# Patient Record
Sex: Male | Born: 1985 | Race: White | Hispanic: No | Marital: Married | Smoking: Never smoker
Health system: Southern US, Community
[De-identification: ages and names within clinical notes are randomized; demographics above are authoritative.]

## PROBLEM LIST (undated history)

## (undated) HISTORY — PX: TONSILLECTOMY: SUR1361

---
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
## 2005-02-17 ENCOUNTER — Encounter: Admission: RE | Admit: 2005-02-17 | Discharge: 2005-02-17 | Payer: Self-pay | Admitting: Occupational Medicine

## 2005-07-23 ENCOUNTER — Emergency Department (HOSPITAL_COMMUNITY): Admission: EM | Admit: 2005-07-23 | Discharge: 2005-07-23 | Payer: Self-pay | Admitting: *Deleted
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
## 2009-12-20 ENCOUNTER — Emergency Department (HOSPITAL_BASED_OUTPATIENT_CLINIC_OR_DEPARTMENT_OTHER): Admission: EM | Admit: 2009-12-20 | Discharge: 2009-12-20 | Payer: Self-pay | Admitting: Emergency Medicine

## 2009-12-20 ENCOUNTER — Ambulatory Visit: Payer: Self-pay | Admitting: Interventional Radiology

## 2011-01-14 LAB — BASIC METABOLIC PANEL
BUN: 19 mg/dL (ref 6–23)
CO2: 34 mEq/L — ABNORMAL HIGH (ref 19–32)
Calcium: 9 mg/dL (ref 8.4–10.5)
Chloride: 102 mEq/L (ref 96–112)
Creatinine, Ser: 1.1 mg/dL (ref 0.4–1.5)
GFR calc Af Amer: >60 mL/min (ref >60)
GFR calc non Af Amer: >60 mL/min (ref >60)
Glucose, Bld: 102 mg/dL — ABNORMAL HIGH (ref 70–99)
Potassium: 4.3 mEq/L (ref 3.5–5.1)
Sodium: 142 mEq/L (ref 135–145)

## 2011-01-14 LAB — DIFFERENTIAL
Eosinophils Absolute: 0.3 10*3/uL (ref 0.0–0.7)
Eosinophils Relative: 4 % (ref 0–5)
Lymphs Abs: 2.1 10*3/uL (ref 0.7–4.0)
Monocytes Absolute: 0.6 10*3/uL (ref 0.1–1.0)
Monocytes Relative: 8 % (ref 3–12)

## 2011-01-14 LAB — CBC
HCT: 44 % (ref 39.0–52.0)
Hemoglobin: 14.9 g/dL (ref 13.0–17.0)
MCV: 88.7 fL (ref 78.0–100.0)
RBC: 4.95 MIL/uL (ref 4.22–5.81)
WBC: 7.7 10*3/uL (ref 4.0–10.5)

## 2011-01-14 LAB — URINALYSIS, ROUTINE W REFLEX MICROSCOPIC
Glucose, UA: NEGATIVE mg/dL
Hgb urine dipstick: NEGATIVE
Ketones, ur: NEGATIVE mg/dL
Protein, ur: NEGATIVE mg/dL

## 2011-01-14 LAB — POCT TOXICOLOGY PANEL

## 2011-01-14 LAB — POCT CARDIAC MARKERS
CKMB, poc: <1 ng/mL — ABNORMAL LOW (ref 1.0–8.0)
Troponin i, poc: <0.05 ng/mL (ref 0.00–0.09)

## 2014-02-25 ENCOUNTER — Ambulatory Visit (INDEPENDENT_AMBULATORY_CARE_PROVIDER_SITE_OTHER): Payer: 59 | Admitting: Internal Medicine

## 2014-02-25 ENCOUNTER — Encounter: Payer: Self-pay | Admitting: Internal Medicine

## 2014-02-25 VITALS — BP 134/88 | HR 84 | Temp 99.0°F | Resp 16 | Ht 75.0 in | Wt 227.0 lb

## 2014-02-25 DIAGNOSIS — S61409A Unspecified open wound of unspecified hand, initial encounter: Secondary | ICD-10-CM

## 2014-02-25 DIAGNOSIS — M79609 Pain in unspecified limb: Secondary | ICD-10-CM

## 2014-02-25 DIAGNOSIS — M79643 Pain in unspecified hand: Secondary | ICD-10-CM

## 2014-02-25 DIAGNOSIS — Z23 Encounter for immunization: Secondary | ICD-10-CM

## 2014-02-25 NOTE — Progress Notes (Signed)
   Subjective:    Patient ID: Clifford DallyKennard Kelly, male    DOB: 1986/09/04, 28 y.o.   MRN: [id number]  HPI    Review of Systems     Objective:   Physical Exam        Assessment & Plan:

## 2014-02-25 NOTE — Progress Notes (Addendum)
Procedure Note: Verbal consent obtained from the patient.  Local anesthesia with 3 cc Lidocaine 2% without epinephrine.  Wounds scrubbed with soap and water.  Wounds explored.  No foreign bodies or deep structure injury noted.  Wound length <2.5cm.  Wounds closed with #7 sutures of 5-0 ethilon (#5 HM, #2 SI.)  Area cleansed and dressed.  Pt tolerated very well.  Anticipate suture removal in 7-10 days.   Injured on chain saw. No function loss. Needs repair. No tendons or nerves involved.  Hand and fingers NMVS intact fully Has full rom  Needs TD  Wound repaired and dressed.

## 2014-02-25 NOTE — Patient Instructions (Signed)

## 2014-03-12 ENCOUNTER — Telehealth: Payer: Self-pay

## 2014-03-12 NOTE — Telephone Encounter (Signed)
Can we pull this guys charge slip please and give to a PA tomorrow. And please send this message to the PA pool too please. Thanks so much For the PA: He needs a letter for [employer] saying how long the wound was. It is ok to write that it is 7.6-12.5 cm or whatever code was used. Thanks

## 2014-03-12 NOTE — Telephone Encounter (Signed)
It does not look like wound length was documented - if we pull the charge slip we can see the procedure code that was billed which indicates laceration length.  If I recall, the pt had multiple small wounds.  Additionally he was due to have sutures removed between 5/11 - 5/14.  He needs to RTC for suture removal if he has not had them removed elsewhere

## 2014-03-12 NOTE — Telephone Encounter (Signed)
Do we have documentation of size of the laceration?

## 2014-03-12 NOTE — Telephone Encounter (Signed)
PT STATES HE HAD A WOUND AND WANTED TO KNOW THE WIDTH AND DEPTH OF IT. PLEASE CALL [phone] HAD STITCHES PUT IN BUT DON'T THINK IT WAS MEASURED

## 2014-03-13 NOTE — Telephone Encounter (Signed)
I was able to look at the revision history of the note, and Elizabeth WAS the PA in this case. Dr. Perrin MalteseGuest took over her note.

## 2014-03-13 NOTE — Telephone Encounter (Signed)
I've spoken with Marylene Landngela and Raynelle FanningJulie to get the charge slip pulled. The charge was for procedure 12001, which means that the wound was <2.5 cm. It is OK to provide that information to the patient [facility]. I do not recall the patient. It appears that the PA wrote a procedure note and that the physician made himself the author and added to it, rather than completing his own note.

## 2014-03-14 NOTE — Telephone Encounter (Signed)
Spoke to pt- he just needs a copy of his office note with the procedure completed and a description of his injury including length of laceration. Pt asks for this to be mailed to him.

## 2014-03-14 NOTE — Telephone Encounter (Signed)
Lm for rtn call- Need to find out if the pt needs a form filled out about this or just the information?

## 2014-03-15 NOTE — Telephone Encounter (Signed)
Please print a copy of the office notes for his visit and mail to patient

## 2014-03-15 NOTE — Telephone Encounter (Signed)
My procedure note has been addended.  Please print for patient.

## 2014-03-15 NOTE — Telephone Encounter (Signed)
Pt notified. Mailed out to him

## 2014-03-15 NOTE — Telephone Encounter (Signed)
The length of the laceration is not documented. Can an addendum be added to the note to include this information?

## 2021-07-09 ENCOUNTER — Ambulatory Visit (INDEPENDENT_AMBULATORY_CARE_PROVIDER_SITE_OTHER): Payer: 59 | Admitting: Family Medicine

## 2021-07-09 ENCOUNTER — Other Ambulatory Visit: Payer: Self-pay

## 2021-07-09 ENCOUNTER — Encounter (HOSPITAL_BASED_OUTPATIENT_CLINIC_OR_DEPARTMENT_OTHER): Payer: Self-pay | Admitting: Family Medicine

## 2021-07-09 VITALS — BP 162/110 | HR 65 | Ht 75.0 in | Wt 230.6 lb

## 2021-07-09 DIAGNOSIS — M545 Low back pain, unspecified: Secondary | ICD-10-CM

## 2021-07-09 DIAGNOSIS — Z0000 Encounter for general adult medical examination without abnormal findings: Secondary | ICD-10-CM

## 2021-07-09 DIAGNOSIS — G8929 Other chronic pain: Secondary | ICD-10-CM | POA: Diagnosis not present

## 2021-07-09 DIAGNOSIS — R030 Elevated blood-pressure reading, without diagnosis of hypertension: Secondary | ICD-10-CM | POA: Diagnosis not present
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
## 2021-07-09 NOTE — Assessment & Plan Note (Signed)
Blood pressure elevated in the office today, denies any history of high blood pressure Recommend monitoring at home, focusing on lifestyle modifications including DASH diet, working towards gradual, healthy weight loss Continue to monitor blood pressure at future office visits Recommend checking intermittently at home if able to

## 2021-07-09 NOTE — Assessment & Plan Note (Signed)
Discussed likely etiology for symptoms.  Given that he is doing well currently, will provide with home exercises for patient to complete Discussed that issue is unlikely to be a surgical problem at this point and that if recurrence does happen, likely would proceed with conservative measures including physical therapy, OTC measures for control of pain Plan for expectant management at this time

## 2021-07-09 NOTE — Patient Instructions (Addendum)
Medication Instructions:  Your physician recommends that you continue on your current medications as directed. Please refer to the Current Medication list given to you today. --If you need a refill on any your medications before your next appointment, please call your pharmacy first. If no refills are authorized on file call the office.-- Follow-Up: Your next appointment:   Your physician recommends that you schedule a follow-up appointment in: 1-2 MONTHS for CPE with Dr. de [place]  Thanks for letting us be apart of your health journey!!  Primary Care and Sports Medicine   Dr. Ceasar Mons [place]   We encourage you to activate your patient portal called "MyChart".  Sign up information is provided on this After Visit Summary.  MyChart is used to connect with patients for Virtual Visits (Telemedicine).  Patients are able to view lab/test results, encounter notes, upcoming appointments, etc.  Non-urgent messages can be sent to your provider as well. To learn more about what you can do with MyChart, please visit --  [email or web address].     Back Exercises The following exercises strengthen the muscles that help to support the trunk (torso) and back. They also help to keep the lower back flexible. Doing these exercises can help to prevent or lessen existing low back pain. If you have back pain or discomfort, try doing these exercises 2-3 times each day or as told by your health care provider. As your pain improves, do them once each day, but increase the number of times that you repeat the steps for each exercise (do more repetitions). To prevent the recurrence of back pain, continue to do these exercises once each day or as told by your health care provider. Do exercises exactly as told by your health care provider and adjust them as directed. It is normal to feel mild stretching, pulling, tightness, or discomfort as you do these exercises, but you should stop right away if you feel sudden pain or  your pain gets worse. Exercises Single knee to chest Repeat these steps 3-5 times for each leg: Lie on your back on a firm bed or the floor with your legs extended. Bring one knee to your chest. Your other leg should stay extended and in contact with the floor. Hold your knee in place by grabbing your knee or thigh with both hands and hold. Pull on your knee until you feel a gentle stretch in your lower back or buttocks. Hold the stretch for 10-30 seconds. Slowly release and straighten your leg. Pelvic tilt Repeat these steps 5-10 times: Lie on your back on a firm bed or the floor with your legs extended. Bend your knees so they are pointing toward the ceiling and your feet are flat on the floor. Tighten your lower abdominal muscles to press your lower back against the floor. This motion will tilt your pelvis so your tailbone points up toward the ceiling instead of pointing to your feet or the floor. With gentle tension and even breathing, hold this position for 5-10 seconds. Cat-cow Repeat these steps until your lower back becomes more flexible: Get into a hands-and-knees position on a firm bed or the floor. Keep your hands under your shoulders, and keep your knees under your hips. You may place padding under your knees for comfort. Let your head hang down toward your chest. Contract your abdominal muscles and point your tailbone toward the floor so your lower back becomes rounded like the back of a cat. Hold this position for 5 seconds. Slowly lift  your head, let your abdominal muscles relax, and point your tailbone up toward the ceiling so your back forms a sagging arch like the back of a cow. Hold this position for 5 seconds.  Press-ups Repeat these steps 5-10 times: Lie on your abdomen (face-down) on a firm bed or the floor. Place your palms near your head, about shoulder-width apart. Keeping your back as relaxed as possible and keeping your hips on the floor, slowly straighten your  arms to raise the top half of your body and lift your shoulders. Do not use your back muscles to raise your upper torso. You may adjust the placement of your hands to make yourself more comfortable. Hold this position for 5 seconds while you keep your back relaxed. Slowly return to lying flat on the floor.  Bridges Repeat these steps 10 times: Lie on your back on a firm bed or the floor. Bend your knees so they are pointing toward the ceiling and your feet are flat on the floor. Your arms should be flat at your sides, next to your body. Tighten your buttocks muscles and lift your buttocks off the floor until your waist is at almost the same height as your knees. You should feel the muscles working in your buttocks and the back of your thighs. If you do not feel these muscles, slide your feet 1-2 inches (2.5-5 cm) farther away from your buttocks. Hold this position for 3-5 seconds. Slowly lower your hips to the starting position, and allow your buttocks muscles to relax completely. If this exercise is too easy, try doing it with your arms crossed over your chest. Abdominal crunches Repeat these steps 5-10 times: Lie on your back on a firm bed or the floor with your legs extended. Bend your knees so they are pointing toward the ceiling and your feet are flat on the floor. Cross your arms over your chest. Tip your chin slightly toward your chest without bending your neck. Tighten your abdominal muscles and slowly raise your torso high enough to lift your shoulder blades a tiny bit off the floor. Avoid raising your torso higher than that because it can put too much stress on your lower back and does not help to strengthen your abdominal muscles. Slowly return to your starting position. Back lifts Repeat these steps 5-10 times: Lie on your abdomen (face-down) with your arms at your sides, and rest your forehead on the floor. Tighten the muscles in your legs and your buttocks. Slowly lift your  chest off the floor while you keep your hips pressed to the floor. Keep the back of your head in line with the curve in your back. Your eyes should be looking at the floor. Hold this position for 3-5 seconds. Slowly return to your starting position. Contact a health care provider if: Your back pain or discomfort gets much worse when you do an exercise. Your worsening back pain or discomfort does not lessen within 2 hours after you exercise. If you have any of these problems, stop doing these exercises right away. Do not do them again unless your health care provider says that you can. Get help right away if: You develop sudden, severe back pain. If this happens, stop doing the exercises right away. Do not do them again unless your health care provider says that you can. This information is not intended to replace advice given to you by your health care provider. Make sure you discuss any questions you have with your health care  provider. Document Revised: 12/24/2020 Document Reviewed: 12/24/2020 Elsevier Patient Education  2022 [employer].

## 2021-09-04 ENCOUNTER — Ambulatory Visit (INDEPENDENT_AMBULATORY_CARE_PROVIDER_SITE_OTHER): Payer: 59 | Admitting: Family Medicine

## 2021-09-04 ENCOUNTER — Other Ambulatory Visit: Payer: Self-pay

## 2021-09-04 DIAGNOSIS — Z0000 Encounter for general adult medical examination without abnormal findings: Secondary | ICD-10-CM

## 2021-09-04 LAB — COMPREHENSIVE METABOLIC PANEL
ALT: 40 IU/L (ref 0–44)
AST: 31 IU/L (ref 0–40)
Albumin/Globulin Ratio: 2.2 (ref 1.2–2.2)
Albumin: 4.6 g/dL (ref 4.0–5.0)
Alkaline Phosphatase: 57 IU/L (ref 44–121)
BUN/Creatinine Ratio: 19 (ref 9–20)
BUN: 22 mg/dL — ABNORMAL HIGH (ref 6–20)
Bilirubin Total: 0.6 mg/dL (ref 0.0–1.2)
CO2: 24 mmol/L (ref 20–29)
Calcium: 9.6 mg/dL (ref 8.7–10.2)
Chloride: 103 mmol/L (ref 96–106)
Creatinine, Ser: 1.14 mg/dL (ref 0.76–1.27)
Globulin, Total: 2.1 g/dL (ref 1.5–4.5)
Glucose: 113 mg/dL — ABNORMAL HIGH (ref 70–99)
Potassium: 4.7 mmol/L (ref 3.5–5.2)
Sodium: 139 mmol/L (ref 134–144)
Total Protein: 6.7 g/dL (ref 6.0–8.5)
eGFR: 86 mL/min/{1.73_m2} (ref >59)

## 2021-09-04 LAB — LIPID PANEL
Chol/HDL Ratio: 3.3 ratio (ref 0.0–5.0)
Cholesterol, Total: 180 mg/dL (ref 100–199)
HDL: 54 mg/dL (ref >39)
LDL Chol Calc (NIH): 115 mg/dL — ABNORMAL HIGH (ref 0–99)
Triglycerides: 56 mg/dL (ref 0–149)
VLDL Cholesterol Cal: 11 mg/dL (ref 5–40)

## 2021-09-04 LAB — CBC WITH DIFFERENTIAL/PLATELET
Basophils Absolute: 0.1 10*3/uL (ref 0.0–0.2)
Basos: 1 %
EOS (ABSOLUTE): 0.1 10*3/uL (ref 0.0–0.4)
Eos: 2 %
Hematocrit: 46.9 % (ref 37.5–51.0)
Hemoglobin: 15.9 g/dL (ref 13.0–17.7)
Immature Grans (Abs): 0 10*3/uL (ref 0.0–0.1)
Immature Granulocytes: 0 %
Lymphocytes Absolute: 2.2 10*3/uL (ref 0.7–3.1)
Lymphs: 28 %
MCH: 30.5 pg (ref 26.6–33.0)
MCHC: 33.9 g/dL (ref 31.5–35.7)
MCV: 90 fL (ref 79–97)
Monocytes Absolute: 0.6 10*3/uL (ref 0.1–0.9)
Monocytes: 7 %
Neutrophils Absolute: 5 10*3/uL (ref 1.4–7.0)
Neutrophils: 62 %
Platelets: 311 10*3/uL (ref 150–450)
RBC: 5.21 x10E6/uL (ref 4.14–5.80)
RDW: 12.4 % (ref 11.6–15.4)
WBC: 8 10*3/uL (ref 3.4–10.8)

## 2021-09-04 NOTE — Progress Notes (Signed)
   Established Patient Nurse Visit  ------------------------------------------------------------------------------------------  Patient presents in office today for lab work Lab requisition printed and given to [employer] Patient labs drawn by [employer]

## 2021-09-08 ENCOUNTER — Encounter (HOSPITAL_BASED_OUTPATIENT_CLINIC_OR_DEPARTMENT_OTHER): Payer: Self-pay | Admitting: Family Medicine

## 2021-09-08 ENCOUNTER — Other Ambulatory Visit: Payer: Self-pay

## 2021-09-08 ENCOUNTER — Ambulatory Visit (INDEPENDENT_AMBULATORY_CARE_PROVIDER_SITE_OTHER): Payer: 59 | Admitting: Family Medicine

## 2021-09-08 DIAGNOSIS — R7309 Other abnormal glucose: Secondary | ICD-10-CM | POA: Diagnosis not present

## 2021-09-08 DIAGNOSIS — Z0000 Encounter for general adult medical examination without abnormal findings: Secondary | ICD-10-CM | POA: Diagnosis not present

## 2021-09-08 NOTE — Progress Notes (Signed)
Subjective:    CC: Annual Physical Exam  HPI:  Clifford Kelly is a 35 y.o. presenting for annual physical  I reviewed the past medical history, family history, social history, surgical history, and allergies today and no changes were needed.  Please see the problem list section below in epic for further details.  Past Medical History: History reviewed. No pertinent past medical history. Past Surgical History: Past Surgical History:  Procedure Laterality Date   TONSILLECTOMY     Social History: Social History   Socioeconomic History   Marital status: Married    Spouse name: Not on file   Number of children: Not on file   Years of education: Not on file   Highest education level: Not on file  Occupational History   Occupation: Tree Cutter/Landscaper  Tobacco Use   Smoking status: Never    Passive exposure: Never   Smokeless tobacco: Never  Vaping Use   Vaping Use: Never used  Substance and Sexual Activity   Alcohol use: Yes    Alcohol/week: 21.0 standard drinks    Types: 21 Cans of beer per week    Comment: 2-3 beers daily   Drug use: Never   Sexual activity: Yes  Other Topics Concern   Not on file  Social History Narrative   Not on file   Social Determinants of Health   Financial Resource Strain: Not on file  Food Insecurity: Not on file  Transportation Needs: Not on file  Physical Activity: Not on file  Stress: Not on file  Social Connections: Not on file   Family History: Family History  Problem Relation Age of Onset   Hyperlipidemia Mother    Hypertension Mother    Heart disease Mother    Acne Mother    Autism Brother    Cancer Maternal Aunt        breast and brain   Cancer Maternal Grandmother        breast and brain   Emphysema Maternal Grandmother    Cancer Maternal Grandfather        prostate   Emphysema Maternal Grandfather    Cancer Paternal Grandmother    Cancer Paternal Grandfather        prostate   Allergies: No Known  Allergies Medications: See med rec.  Review of Systems: No headache, visual changes, nausea, vomiting, diarrhea, constipation, dizziness, abdominal pain, skin rash, fevers, chills, night sweats, swollen lymph nodes, weight loss, chest pain, body aches, joint swelling, muscle aches, shortness of breath, mood changes, visual or auditory hallucinations.  Objective:    General: Well Developed, well nourished, and in no acute distress.  Neuro: Alert and oriented x3, extra-ocular muscles intact, sensation grossly intact. Cranial nerves II through XII are intact, motor, sensory, and coordinative functions are all intact. HEENT: Normocephalic, atraumatic, pupils equal round reactive to light, neck supple, no masses, no lymphadenopathy, thyroid nonpalpable. Oropharynx, nasopharynx, external ear canals are unremarkable. Skin: Warm and dry, no rashes noted.  Cardiac: Regular rate and rhythm, no murmurs rubs or gallops.  Respiratory: Clear to auscultation bilaterally. Not using accessory muscles, speaking in full sentences.  Abdominal: Soft, nontender, nondistended, positive bowel sounds, no masses, no organomegaly.  Musculoskeletal: Shoulder, elbow, wrist, hip, knee, ankle stable, and with full range of motion.  Impression and Recommendations:    Wellness examination Overall, well-appearing male Patient was counseled, risk factors were discussed, anticipatory guidance given. Discussed tetanus immunization recommendations -patient thinks that he received tetanus immunization about 6 years ago after he cut  himself with a chainsaw, this has been updated in his chart Recommend annual influenza vaccine, patient declines today Discussed role of healthy lifestyle including appropriate dietary choices, regular aerobic and anaerobic physical activity Labs completed previously, reviewed with patient.  We will add on hemoglobin A1c for abnormal blood sugar reading  Plan for follow-up in 1 year for CPE or sooner  as needed.  If hemoglobin A1c is within prediabetes or diabetes range, will have patient follow-up sooner   ___________________________________________ Adalina Dopson de [place], MD, ABFM, CAQSM Primary Care and Sports Medicine [facility]

## 2021-09-08 NOTE — Assessment & Plan Note (Signed)
Overall, well-appearing male Patient was counseled, risk factors were discussed, anticipatory guidance given. Discussed tetanus immunization recommendations -patient thinks that he received tetanus immunization about 6 years ago after he cut himself with a chainsaw, this has been updated in his chart Recommend annual influenza vaccine, patient declines today Discussed role of healthy lifestyle including appropriate dietary choices, regular aerobic and anaerobic physical activity Labs completed previously, reviewed with patient.  We will add on hemoglobin A1c for abnormal blood sugar reading

## 2021-09-08 NOTE — Patient Instructions (Signed)
°  Medication Instructions:  °Your physician recommends that you continue on your current medications as directed. Please refer to the Current Medication list given to you today. °--If you need a refill on any your medications before your next appointment, please call your pharmacy first. If no refills are authorized on file call the office.-- °Follow-Up: °Your next appointment:   °Your physician recommends that you schedule a follow-up appointment in: 1 YEAR with Dr. de [place] ° °You will receive a text message or e-mail with a link to a survey about your care and experience with us today! We would greatly appreciate your feedback!  ° °Thanks for letting us be apart of your health journey!!  °Primary Care and Sports Medicine  ° °Dr. Raymond de [place]  ° °We encourage you to activate your patient portal called "MyChart".  Sign up information is provided on this After Visit Summary.  MyChart is used to connect with patients for Virtual Visits (Telemedicine).  Patients are able to view lab/test results, encounter notes, upcoming appointments, etc.  Non-urgent messages can be sent to your provider as well. To learn more about what you can do with MyChart, please visit --  [email or web address]   ° °

## 2021-09-09 LAB — SPECIMEN STATUS REPORT

## 2021-09-09 LAB — HEMOGLOBIN A1C
Est. average glucose Bld gHb Est-mCnc: 105 mg/dL
Hgb A1c MFr Bld: 5.3 % (ref 4.8–5.6)

## 2023-05-05 ENCOUNTER — Ambulatory Visit (INDEPENDENT_AMBULATORY_CARE_PROVIDER_SITE_OTHER): Payer: 59 | Admitting: Family Medicine

## 2023-05-05 ENCOUNTER — Encounter: Payer: Self-pay | Admitting: Family Medicine

## 2023-05-05 VITALS — BP 128/92 | HR 68 | Temp 98.1°F | Ht 75.0 in | Wt 234.0 lb

## 2023-05-05 DIAGNOSIS — R002 Palpitations: Secondary | ICD-10-CM | POA: Diagnosis not present

## 2023-05-05 DIAGNOSIS — R1031 Right lower quadrant pain: Secondary | ICD-10-CM | POA: Diagnosis not present

## 2023-05-05 DIAGNOSIS — Z0000 Encounter for general adult medical examination without abnormal findings: Secondary | ICD-10-CM | POA: Insufficient documentation

## 2023-05-05 DIAGNOSIS — R030 Elevated blood-pressure reading, without diagnosis of hypertension: Secondary | ICD-10-CM | POA: Diagnosis not present

## 2023-05-05 DIAGNOSIS — Z1159 Encounter for screening for other viral diseases: Secondary | ICD-10-CM

## 2023-05-05 DIAGNOSIS — Z114 Encounter for screening for human immunodeficiency virus [HIV]: Secondary | ICD-10-CM

## 2023-05-05 DIAGNOSIS — Z0001 Encounter for general adult medical examination with abnormal findings: Secondary | ICD-10-CM

## 2023-05-05 NOTE — Assessment & Plan Note (Signed)

## 2023-05-05 NOTE — Assessment & Plan Note (Signed)
BP elevated in office today. He does report a history of this. Will check BP at home over the weekend and report to office. Discussed the importance of maintaining a healthy weight, heart healthy diet such as DASH or Mediterranean, and 150 minutes moderate intensity exercise weekly. Fasting labs tomorrow.

## 2023-05-05 NOTE — Assessment & Plan Note (Addendum)
No hernia noted on exam. Suspect MSK. Encouraged stretching exercises and NSAIDs, return to office if symptoms persist or worsen,

## 2023-05-05 NOTE — Assessment & Plan Note (Signed)
Will obtain baseline EKG. Instructed to seek medical care for chest pain, dyspnea on exertion, lightheadedness/dizziness, vision changes, recurrent headaches, swelling of extremities.

## 2023-05-05 NOTE — Progress Notes (Signed)
New Patient Office Visit  Subjective    Patient ID: Clifford Kelly, male    DOB: 09/26/1986  Age: 37 y.o. MRN: [id number]  CC:  Chief Complaint  Patient presents with   Establish Care    HPI Clifford Kelly presents to establish care. Oriented to practice routines and expectations. PMH includes none. Concerns include none. He does report history of abnormal EKGs with physicals and occasional palpitations. His mother does have a pacemaker. He denies chest pain, dyspnea with exertion, lightheadedness/dizziness, recurrent headaches, vision changes, swelling of extremities. He does also report elevated BP readings in past.   Other concerns include right groin pain for a week that is throbbing. He denies bulging, testicular pain, swelling, penile discharge, dysuria.   Tobacco: non-smoker STI: declines Vaccines:  UTD    No outpatient encounter medications on file as of 05/05/2023.   No facility-administered encounter medications on file as of 05/05/2023.    History reviewed. No pertinent past medical history.  Past Surgical History:  Procedure Laterality Date   TONSILLECTOMY      Family History  Problem Relation Age of Onset   Hyperlipidemia Mother    Hypertension Mother    Heart disease Mother    Acne Mother    Autism Brother    Cancer Maternal Aunt        breast and brain   Cancer Maternal Grandmother        breast and brain   Emphysema Maternal Grandmother    Cancer Maternal Grandfather        prostate   Emphysema Maternal Grandfather    Cancer Paternal Grandmother    Cancer Paternal Grandfather        prostate    Social History   Socioeconomic History   Marital status: Married    Spouse name: Not on file   Number of children: Not on file   Years of education: Not on file   Highest education level: Not on file  Occupational History   Occupation: Tree Cutter/Landscaper  Tobacco Use   Smoking status: Never    Passive exposure: Never   Smokeless tobacco:  Never  Vaping Use   Vaping status: Never Used  Substance and Sexual Activity   Alcohol use: Yes    Alcohol/week: 21.0 standard drinks of alcohol    Types: 21 Cans of beer per week    Comment: 2-3 beers daily   Drug use: Never   Sexual activity: Yes  Other Topics Concern   Not on file  Social History Narrative   Not on file   Social Determinants of Health   Financial Resource Strain: Not on file  Food Insecurity: Not on file  Transportation Needs: Not on file  Physical Activity: Not on file  Stress: Not on file  Social Connections: Not on file  Intimate Partner Violence: Not on file    Review of Systems  Constitutional: Negative.   HENT: Negative.    Eyes: Negative.   Respiratory: Negative.    Cardiovascular:  Positive for palpitations.  Gastrointestinal: Negative.   Genitourinary: Negative.   Musculoskeletal: Negative.   Skin: Negative.   Neurological: Negative.   Endo/Heme/Allergies: Negative.   Psychiatric/Behavioral: Negative.    All other systems reviewed and are negative.       Objective    BP (!) 128/92   Pulse 68   Temp 98.1 F (36.7 C) (Oral)   Ht 6\' 3"  (1.905 m)   Wt 234 lb (106.1 kg)   SpO2 98%  BMI 29.25 kg/m   Physical Exam Vitals and nursing note reviewed. Exam conducted with a chaperone present.  Constitutional:      Appearance: Normal appearance. He is normal weight.  HENT:     Head: Normocephalic and atraumatic.     Right Ear: Tympanic membrane, ear canal and external ear normal.     Left Ear: Tympanic membrane, ear canal and external ear normal.     Nose: Nose normal.     Mouth/Throat:     Mouth: Mucous membranes are moist.     Pharynx: Oropharynx is clear.  Eyes:     Extraocular Movements: Extraocular movements intact.     Right eye: Normal extraocular motion and no nystagmus.     Left eye: Normal extraocular motion and no nystagmus.     Conjunctiva/sclera: Conjunctivae normal.     Pupils: Pupils are equal, round, and  reactive to light.  Cardiovascular:     Rate and Rhythm: Normal rate and regular rhythm.     Pulses: Normal pulses.     Heart sounds: Normal heart sounds.  Pulmonary:     Effort: Pulmonary effort is normal.     Breath sounds: Normal breath sounds.  Abdominal:     General: Bowel sounds are normal.     Palpations: Abdomen is soft.     Hernia: There is no hernia in the right inguinal area.  Genitourinary:    Testes: Normal.     Epididymis:     Right: Normal.     Comments: Deferred using shared decision making Musculoskeletal:        General: Normal range of motion.     Cervical back: Normal range of motion and neck supple.  Lymphadenopathy:     Lower Body: No right inguinal adenopathy.  Skin:    General: Skin is warm and dry.     Capillary Refill: Capillary refill takes less than 2 seconds.  Neurological:     General: No focal deficit present.     Mental Status: He is alert. Mental status is at baseline.  Psychiatric:        Mood and Affect: Mood normal.        Speech: Speech normal.        Behavior: Behavior normal.        Thought Content: Thought content normal.        Cognition and Memory: Cognition and memory normal.        Judgment: Judgment normal.         Assessment & Plan:   Problem List Items Addressed This Visit     Elevated blood pressure reading in office without diagnosis of hypertension    BP elevated in office today. He does report a history of this. Will check BP at home over the weekend and report to office. Discussed the importance of maintaining a healthy weight, heart healthy diet such as DASH or Mediterranean, and 150 minutes moderate intensity exercise weekly. Fasting labs tomorrow.      Physical exam, annual - Primary    Today your medical history was reviewed and routine physical exam with labs was performed. Recommend 150 minutes of moderate intensity exercise weekly and consuming a well-balanced diet. Advised to stop smoking if a smoker, avoid  smoking if a non-smoker, limit alcohol consumption to 1 drink per day for women and 2 drinks per day for men, and avoid illicit drug use. Counseled on safe sex practices and offered STI testing today. Counseled on the importance of sunscreen  use. Counseled in mental health awareness and when to seek medical care. Vaccine maintenance discussed. Appropriate health maintenance items reviewed. Return to office in 1 year for annual physical exam.       Relevant Orders   CBC with Differential/Platelet   COMPLETE METABOLIC PANEL WITH GFR   Lipid panel   Palpitations    Will obtain baseline EKG. Instructed to seek medical care for chest pain, dyspnea on exertion, lightheadedness/dizziness, vision changes, recurrent headaches, swelling of extremities.       Relevant Orders   EKG 12-Lead   TSH   Right groin pain    No hernia noted on exam. Suspect MSK. Encouraged stretching exercises and NSAIDs, return to office if symptoms persist or worsen,      Other Visit Diagnoses     Screening for HIV (human immunodeficiency virus)       Relevant Orders   HIV Antibody (routine testing w rflx)   Need for hepatitis C screening test       Relevant Orders   Hepatitis C antibody       Return in about 4 weeks (around 06/02/2023) for elevated BP.   Park Meo, FNP

## 2023-05-13 ENCOUNTER — Other Ambulatory Visit: Payer: 59

## 2023-05-13 DIAGNOSIS — R739 Hyperglycemia, unspecified: Secondary | ICD-10-CM | POA: Diagnosis not present

## 2023-05-13 DIAGNOSIS — Z1159 Encounter for screening for other viral diseases: Secondary | ICD-10-CM | POA: Diagnosis not present

## 2023-05-13 DIAGNOSIS — Z0000 Encounter for general adult medical examination without abnormal findings: Secondary | ICD-10-CM | POA: Diagnosis not present

## 2023-05-13 DIAGNOSIS — Z114 Encounter for screening for human immunodeficiency virus [HIV]: Secondary | ICD-10-CM | POA: Diagnosis not present

## 2023-05-13 DIAGNOSIS — R002 Palpitations: Secondary | ICD-10-CM | POA: Diagnosis not present

## 2023-05-13 LAB — CBC WITH DIFFERENTIAL/PLATELET
Absolute Monocytes: 583 cells/uL (ref 200–950)
Basophils Absolute: 72 cells/uL (ref 0–200)
Eosinophils Absolute: 151 cells/uL (ref 15–500)
Eosinophils Relative: 2.1 %
HCT: 44.8 % (ref 38.5–50.0)
Lymphs Abs: 2232 cells/uL (ref 850–3900)
Monocytes Relative: 8.1 %
WBC: 7.2 10*3/uL (ref 3.8–10.8)

## 2023-05-14 LAB — CBC WITH DIFFERENTIAL/PLATELET
Basophils Relative: 1 %
MCHC: 33.7 g/dL (ref 32.0–36.0)
MPV: 9.8 fL (ref 7.5–12.5)
Neutrophils Relative %: 57.8 %
Platelets: 300 10*3/uL (ref 140–400)
RBC: 5 10*6/uL (ref 4.20–5.80)
RDW: 12.6 % (ref 11.0–15.0)

## 2023-05-14 LAB — LIPID PANEL
Cholesterol: 171 mg/dL (ref <200)
Non-HDL Cholesterol (Calc): 113 mg/dL (calc) (ref <130)
Total CHOL/HDL Ratio: 2.9 (calc) (ref <5.0)

## 2023-05-14 LAB — COMPLETE METABOLIC PANEL WITH GFR
AG Ratio: 2.2 (calc) (ref 1.0–2.5)
Albumin: 4.6 g/dL (ref 3.6–5.1)
CO2: 28 mmol/L (ref 20–32)
Chloride: 102 mmol/L (ref 98–110)
Total Bilirubin: 0.6 mg/dL (ref 0.2–1.2)
eGFR: 97 mL/min/{1.73_m2} (ref >=60)

## 2023-05-14 LAB — HEPATITIS C ANTIBODY: Hepatitis C Ab: NONREACTIVE

## 2023-05-17 LAB — COMPLETE METABOLIC PANEL WITH GFR
ALT: 33 U/L (ref 9–46)
AST: 27 U/L (ref 10–40)
Alkaline phosphatase (APISO): 43 U/L (ref 36–130)
BUN: 17 mg/dL (ref 7–25)
Calcium: 9.4 mg/dL (ref 8.6–10.3)
Creat: 1.02 mg/dL (ref 0.60–1.26)
Globulin: 2.1 g/dL (calc) (ref 1.9–3.7)
Glucose, Bld: 110 mg/dL — ABNORMAL HIGH (ref 65–99)
Potassium: 4.5 mmol/L (ref 3.5–5.3)
Sodium: 139 mmol/L (ref 135–146)
Total Protein: 6.7 g/dL (ref 6.1–8.1)

## 2023-05-17 LAB — TEST AUTHORIZATION

## 2023-05-17 LAB — CBC WITH DIFFERENTIAL/PLATELET
Hemoglobin: 15.1 g/dL (ref 13.2–17.1)
MCH: 30.2 pg (ref 27.0–33.0)
MCV: 89.6 fL (ref 80.0–100.0)
Neutro Abs: 4162 cells/uL (ref 1500–7800)
Total Lymphocyte: 31 %

## 2023-05-17 LAB — LIPID PANEL
HDL: 58 mg/dL (ref >=40)
LDL Cholesterol (Calc): 99 mg/dL (calc)
Triglycerides: 46 mg/dL (ref <150)

## 2023-05-17 LAB — HEMOGLOBIN A1C
Hgb A1c MFr Bld: 5.5 % of total Hgb (ref <5.7)
Mean Plasma Glucose: 111 mg/dL
eAG (mmol/L): 6.2 mmol/L

## 2023-05-17 LAB — HIV ANTIBODY (ROUTINE TESTING W REFLEX): HIV 1&2 Ab, 4th Generation: NONREACTIVE

## 2023-05-17 LAB — TSH: TSH: 1.61 mIU/L (ref 0.40–4.50)

## 2023-06-19 ENCOUNTER — Encounter: Payer: Self-pay | Admitting: Family Medicine

## 2023-06-23 ENCOUNTER — Ambulatory Visit: Payer: 59 | Attending: Family Medicine

## 2023-06-23 ENCOUNTER — Ambulatory Visit (INDEPENDENT_AMBULATORY_CARE_PROVIDER_SITE_OTHER): Payer: 59 | Admitting: Family Medicine

## 2023-06-23 VITALS — BP 124/92 | HR 67 | Temp 98.5°F | Ht 75.0 in | Wt 231.0 lb

## 2023-06-23 DIAGNOSIS — R001 Bradycardia, unspecified: Secondary | ICD-10-CM

## 2023-06-23 DIAGNOSIS — R42 Dizziness and giddiness: Secondary | ICD-10-CM | POA: Diagnosis not present

## 2023-06-23 DIAGNOSIS — R1013 Epigastric pain: Secondary | ICD-10-CM | POA: Diagnosis not present

## 2023-06-23 MED ORDER — OMEPRAZOLE 20 MG PO CPDR: 20.0000 mg | DELAYED_RELEASE_CAPSULE | Freq: Every day | ORAL | 0 refills | Status: DC

## 2023-06-23 NOTE — Progress Notes (Unsigned)
Enrolled for [employer] to mail a ZIO XT long term holter monitor to the patients address on file.   DOD to read. 

## 2023-06-23 NOTE — Assessment & Plan Note (Addendum)
Sinus brady on EKG, this is his baseline. He reports known history of bradycardia and "abnormal EKGs" as well as family history of cardiac arrhythmias requiring pacer in his mother. No orthostatic hypotension. Will obtain CBC and CMP. Will order long term cardiac monitor.

## 2023-06-23 NOTE — Assessment & Plan Note (Signed)
No red flags on exam. Will obtain CBC, CMP, urea breath test, and start Omeprazole 20mg  daily. Instructed to discontinue Ibuprofen use. If symptoms persist and no findings on labs will refer to GI for endoscopy and further evaluation.

## 2023-06-23 NOTE — Progress Notes (Signed)
Subjective:  HPI: Clifford Kelly is a 37 y.o. male presenting on 06/23/2023 for Follow-up (spells of dizziness and stomach cramps for past 3 weeks - JBG\\\)   HPI Patient is in today for intermittent lightheadedness and dizziness for a month, this has occurred 10-15 times lasting for a day. He has been monitoring his BP at home and it has been 120s/70-80s. He reports symptoms as feeling like he was going to black out when he was mowing the lawn last week and felt weak on his legs Monday. He had a hard day Monday with feeling weak, worsening abdominal pain. He denies feeling like the room is spinning or rocking on a boat, no feeling like he is losing balance. No vision changes, headaches that are changing/new/different.  He also endorses abdominal pain over the last month, he has some days that are better than others. This is middle abdominal pain. Denies fever, nausea, vomiting, diarrhea, constipation, heartburn, indigestion, or burping, anorexia, diet or medication changes. He is having normal soft, formed bowel movements daily and on blood in his stool. Denies flank or back pain. Cannot identify triggers. He has tried cutting out red meat since he has had several tick bites. No known sick exposures. He does take 600-800mg  of Ibuprofen 4 times a week.  Review of Systems  All other systems reviewed and are negative.   Relevant past medical history reviewed and updated as indicated.   No past medical history on file.   Past Surgical History:  Procedure Laterality Date   TONSILLECTOMY      Allergies and medications reviewed and updated.   Current Outpatient Medications:    omeprazole (PRILOSEC) 20 MG capsule, Take 1 capsule (20 mg total) by mouth daily., Disp: 30 capsule, Rfl: 0  No Known Allergies  Objective:   BP (!) 124/92   Pulse 67   Temp 98.5 F (36.9 C) (Oral)   Ht 6\' 3"  (1.905 m)   Wt 231 lb (104.8 kg)   SpO2 97%   BMI 28.87 kg/m       06/23/2023   11:52 AM  06/23/2023   11:50 AM 05/05/2023    2:51 PM  Vitals with BMI  Height  6\' 3"    Weight  231 lbs   BMI  28.87   Systolic 124 132 161  Diastolic 92 92 92  Pulse  67      Physical Exam Vitals and nursing note reviewed.  Constitutional:      Appearance: Normal appearance. He is normal weight.  HENT:     Head: Normocephalic and atraumatic.  Cardiovascular:     Rate and Rhythm: Regular rhythm. Bradycardia present.     Pulses: Normal pulses.     Heart sounds: Normal heart sounds.  Pulmonary:     Effort: Pulmonary effort is normal.     Breath sounds: Normal breath sounds.  Abdominal:     General: Bowel sounds are normal.     Palpations: Abdomen is soft.     Tenderness: There is no abdominal tenderness.  Skin:    General: Skin is warm and dry.     Capillary Refill: Capillary refill takes less than 2 seconds.  Neurological:     General: No focal deficit present.     Mental Status: He is alert and oriented to person, place, and time. Mental status is at baseline.  Psychiatric:        Mood and Affect: Mood normal.        Behavior: Behavior normal.  Thought Content: Thought content normal.        Judgment: Judgment normal.     Assessment & Plan:  Epigastric pain Assessment & Plan: No red flags on exam. Will obtain CBC, CMP, urea breath test, and start Omeprazole 20mg  daily. Instructed to discontinue Ibuprofen use. If symptoms persist and no findings on labs will refer to GI for endoscopy and further evaluation.  Orders: -     CBC with Differential/Platelet -     COMPLETE METABOLIC PANEL WITH GFR -     Amylase -     H. pylori breath test  Lightheadedness Assessment & Plan: Sinus brady on EKG, this is his baseline. He reports known history of bradycardia and "abnormal EKGs" as well as family history of cardiac arrhythmias requiring pacer in his mother. No orthostatic hypotension. Will obtain CBC and CMP. Will order long term cardiac monitor.  Orders: -     EKG  12-Lead -     CBC with Differential/Platelet -     COMPLETE METABOLIC PANEL WITH GFR -     LONG TERM MONITOR (3-14 DAYS); Future  Bradycardia -     LONG TERM MONITOR (3-14 DAYS); Future  Other orders -     Omeprazole; Take 1 capsule (20 mg total) by mouth daily.  Dispense: 30 capsule; Refill: 0     Follow up plan: Return in about 2 weeks (around 07/07/2023).  Park Meo, FNP

## 2023-06-24 ENCOUNTER — Encounter: Payer: Self-pay | Admitting: Family Medicine

## 2023-06-24 LAB — AMYLASE: Amylase: 62 U/L (ref 21–101)

## 2023-06-28 ENCOUNTER — Other Ambulatory Visit: Payer: Self-pay | Admitting: Family Medicine

## 2023-06-28 DIAGNOSIS — R1013 Epigastric pain: Secondary | ICD-10-CM

## 2023-06-28 DIAGNOSIS — R7989 Other specified abnormal findings of blood chemistry: Secondary | ICD-10-CM

## 2023-06-28 DIAGNOSIS — R109 Unspecified abdominal pain: Secondary | ICD-10-CM

## 2023-06-29 LAB — H. PYLORI BREATH TEST: H. pylori Breath Test: NOT DETECTED

## 2023-06-29 NOTE — Telephone Encounter (Signed)
Called and spoke w/D[facility]-Felicia, stated that pt's wife called and schedule pt's CT appointment for Friday at 1pm. [facility], also suggest that pt will need to stop CT and pick up two bottles/containers of contrast for his CT appt on Friday.   Tried calling pt, LVM w/detail information re CT appt on VM per pt's DPR.

## 2023-06-30 ENCOUNTER — Ambulatory Visit
Admission: RE | Admit: 2023-06-30 | Discharge: 2023-06-30 | Disposition: A | Discharge status: Home / Self Care | Payer: 59 | Source: Ambulatory Visit | Attending: Family Medicine | Admitting: Family Medicine

## 2023-06-30 DIAGNOSIS — R7989 Other specified abnormal findings of blood chemistry: Secondary | ICD-10-CM

## 2023-06-30 LAB — COMPLETE METABOLIC PANEL WITH GFR
AG Ratio: 2 (calc) (ref 1.0–2.5)
ALT: 47 U/L — ABNORMAL HIGH (ref 9–46)
AST: 30 U/L (ref 10–40)
Albumin: 4.8 g/dL (ref 3.6–5.1)
Alkaline phosphatase (APISO): 44 U/L (ref 36–130)
BUN/Creatinine Ratio: 22 (calc) (ref 6–22)
BUN: 26 mg/dL — ABNORMAL HIGH (ref 7–25)
CO2: 26 mmol/L (ref 20–32)
Calcium: 10.1 mg/dL (ref 8.6–10.3)
Chloride: 103 mmol/L (ref 98–110)
Creat: 1.19 mg/dL (ref 0.60–1.26)
Globulin: 2.4 g/dL (ref 1.9–3.7)
Glucose, Bld: 86 mg/dL (ref 65–99)
Potassium: 4.4 mmol/L (ref 3.5–5.3)
Sodium: 140 mmol/L (ref 135–146)
Total Bilirubin: 0.5 mg/dL (ref 0.2–1.2)
Total Protein: 7.2 g/dL (ref 6.1–8.1)
eGFR: 81 mL/min/{1.73_m2} (ref >=60)

## 2023-06-30 LAB — CBC WITH DIFFERENTIAL/PLATELET
Absolute Monocytes: 721 cells/uL (ref 200–950)
Basophils Absolute: 62 {cells}/uL (ref 0–200)
Basophils Relative: 0.7 %
Eosinophils Absolute: 134 {cells}/uL (ref 15–500)
Eosinophils Relative: 1.5 %
HCT: 45.6 % (ref 38.5–50.0)
Hemoglobin: 15.7 g/dL (ref 13.2–17.1)
Lymphs Abs: 2545 {cells}/uL (ref 850–3900)
MCH: 30.4 pg (ref 27.0–33.0)
MCHC: 34.4 g/dL (ref 32.0–36.0)
MCV: 88.4 fL (ref 80.0–100.0)
MPV: 9.7 fL (ref 7.5–12.5)
Monocytes Relative: 8.1 %
Neutro Abs: 5438 {cells}/uL (ref 1500–7800)
Neutrophils Relative %: 61.1 %
Platelets: 317 10*3/uL (ref 140–400)
RBC: 5.16 10*6/uL (ref 4.20–5.80)
RDW: 12.4 % (ref 11.0–15.0)
Total Lymphocyte: 28.6 %
WBC: 8.9 10*3/uL (ref 3.8–10.8)

## 2023-06-30 LAB — TEST AUTHORIZATION

## 2023-06-30 LAB — IRON,TIBC AND FERRITIN PANEL
%SAT: 36 % (ref 20–48)
Ferritin: 160 ng/mL (ref 38–380)
Iron: 132 ug/dL (ref 50–180)
TIBC: 371 ug/dL (ref 250–425)

## 2023-06-30 LAB — HEPATITIS B SURFACE ANTIBODY, QUANTITATIVE: Hep B S AB Quant (Post): 14 m[IU]/mL (ref >=10)

## 2023-07-01 ENCOUNTER — Ambulatory Visit
Admission: RE | Admit: 2023-07-01 | Discharge: 2023-07-01 | Disposition: A | Discharge status: Home / Self Care | Payer: 59 | Source: Ambulatory Visit | Attending: Family Medicine | Admitting: Family Medicine

## 2023-07-01 DIAGNOSIS — R109 Unspecified abdominal pain: Secondary | ICD-10-CM

## 2023-07-01 MED ORDER — IOPAMIDOL (ISOVUE-300) INJECTION 61%
100.0000 mL | Freq: Once | INTRAVENOUS | Status: AC | PRN
  Administered 2023-07-01: 100 mL via INTRAVENOUS

## 2023-07-03 DIAGNOSIS — R001 Bradycardia, unspecified: Secondary | ICD-10-CM | POA: Diagnosis not present

## 2023-07-03 DIAGNOSIS — R42 Dizziness and giddiness: Secondary | ICD-10-CM | POA: Diagnosis not present

## 2023-07-06 ENCOUNTER — Encounter: Payer: Self-pay | Admitting: Family Medicine

## 2023-07-06 ENCOUNTER — Ambulatory Visit (INDEPENDENT_AMBULATORY_CARE_PROVIDER_SITE_OTHER): Payer: 59 | Admitting: Family Medicine

## 2023-07-06 VITALS — BP 120/92 | HR 52 | Temp 97.8°F | Ht 75.0 in | Wt 229.0 lb

## 2023-07-06 DIAGNOSIS — R1013 Epigastric pain: Secondary | ICD-10-CM | POA: Diagnosis not present

## 2023-07-06 DIAGNOSIS — R42 Dizziness and giddiness: Secondary | ICD-10-CM

## 2023-07-06 NOTE — Assessment & Plan Note (Signed)
Awaiting Zio 14 day results. Symptoms less frequent. Instructed to seek medical care for chest pain, shortness of breath, worsening lightheadedness or syncope.

## 2023-07-06 NOTE — Progress Notes (Signed)
Subjective:  HPI: Clifford Kelly is a 37 y.o. male presenting on 07/06/2023 for Follow-up (2 wk f/u stomach issues)   HPI Patient is in today for follow-up for lightheadedness and epigastric pain. He reports his symptoms are somewhat improved since his initial visit. He has had one episode of lightheadedness, cannot identify triggers. He did receive the Zio patch and was able to wear for about 36 hours before it fell off due to non-adherence, he is awaiting a replacement. [name] is taking Prilosec 20mg  daily and reports mild improvement in epigastric pain. Abdominal CT and RUQ [place] did not identify a cause for his pain, I will place a referral to GI. He denies nausea, vomiting, diarrhea, constipation, shortness of breath, chest pain.  Review of Systems  All other systems reviewed and are negative.   Relevant past medical history reviewed and updated as indicated.   No past medical history on file.   Past Surgical History:  Procedure Laterality Date   TONSILLECTOMY      Allergies and medications reviewed and updated.   Current Outpatient Medications:    omeprazole (PRILOSEC) 20 MG capsule, Take 1 capsule (20 mg total) by mouth daily., Disp: 30 capsule, Rfl: 0  No Known Allergies  Objective:   BP (!) 120/92   Pulse (!) 52   Temp 97.8 F (36.6 C) (Oral)   Ht 6\' 3"  (1.905 m)   Wt 229 lb (103.9 kg)   SpO2 98%   BMI 28.62 kg/m      07/06/2023    9:09 AM 07/06/2023    9:06 AM 06/23/2023   11:52 AM  Vitals with BMI  Height  6\' 3"    Weight  229 lbs   BMI  28.62   Systolic 120 122 782  Diastolic 92 92 92  Pulse  52      Physical Exam Vitals and nursing note reviewed.  Constitutional:      Appearance: Normal appearance. He is normal weight.  HENT:     Head: Normocephalic and atraumatic.  Cardiovascular:     Rate and Rhythm: Regular rhythm. Bradycardia present.     Pulses: Normal pulses.     Heart sounds: Normal heart sounds.  Pulmonary:     Effort: Pulmonary  effort is normal.     Breath sounds: Normal breath sounds.  Abdominal:     General: Bowel sounds are normal.     Palpations: Abdomen is soft.     Tenderness: There is no abdominal tenderness.  Skin:    General: Skin is warm and dry.     Capillary Refill: Capillary refill takes less than 2 seconds.  Neurological:     General: No focal deficit present.     Mental Status: He is alert and oriented to person, place, and time. Mental status is at baseline.  Psychiatric:        Mood and Affect: Mood normal.        Behavior: Behavior normal.        Thought Content: Thought content normal.        Judgment: Judgment normal.     Assessment & Plan:  Epigastric pain Assessment & Plan: Somewhat improved. Continue Prilosec 20mg  daily. Will place referral to GI for endoscopy for persistent pain.  Orders: -     Ambulatory referral to Gastroenterology  Lightheadedness Assessment & Plan: Awaiting Zio 14 day results. Symptoms less frequent. Instructed to seek medical care for chest pain, shortness of breath, worsening lightheadedness or syncope.  Follow up plan: Return if symptoms worsen or fail to improve.  Park Meo, FNP

## 2023-07-06 NOTE — Assessment & Plan Note (Signed)
Somewhat improved. Continue Prilosec 20mg  daily. Will place referral to GI for endoscopy for persistent pain.

## 2023-07-08 DIAGNOSIS — R42 Dizziness and giddiness: Secondary | ICD-10-CM | POA: Diagnosis not present

## 2023-07-16 ENCOUNTER — Other Ambulatory Visit: Payer: Self-pay | Admitting: Family Medicine

## 2023-07-30 ENCOUNTER — Other Ambulatory Visit: Payer: Self-pay | Admitting: Family Medicine

## 2023-07-30 ENCOUNTER — Encounter: Payer: Self-pay | Admitting: Family Medicine

## 2023-08-01 ENCOUNTER — Other Ambulatory Visit: Payer: Self-pay

## 2023-08-01 ENCOUNTER — Encounter: Payer: Self-pay | Admitting: Family Medicine

## 2023-08-01 DIAGNOSIS — R1013 Epigastric pain: Secondary | ICD-10-CM

## 2023-08-01 MED ORDER — OMEPRAZOLE 20 MG PO CPDR: 20.0000 mg | DELAYED_RELEASE_CAPSULE | Freq: Every day | ORAL | 1 refills | Status: AC

## 2023-08-02 ENCOUNTER — Other Ambulatory Visit: Payer: Self-pay | Admitting: Family Medicine

## 2023-08-02 DIAGNOSIS — R42 Dizziness and giddiness: Secondary | ICD-10-CM

## 2023-08-02 DIAGNOSIS — R001 Bradycardia, unspecified: Secondary | ICD-10-CM

## 2023-08-17 ENCOUNTER — Ambulatory Visit: Payer: 59 | Attending: Cardiovascular Disease | Admitting: Cardiovascular Disease

## 2023-08-17 ENCOUNTER — Encounter: Payer: Self-pay | Admitting: Cardiovascular Disease

## 2023-08-17 VITALS — BP 134/86 | HR 59 | Ht 75.0 in | Wt 231.0 lb

## 2023-08-17 DIAGNOSIS — R011 Cardiac murmur, unspecified: Secondary | ICD-10-CM

## 2023-08-17 DIAGNOSIS — R42 Dizziness and giddiness: Secondary | ICD-10-CM

## 2023-08-17 DIAGNOSIS — R001 Bradycardia, unspecified: Secondary | ICD-10-CM | POA: Diagnosis not present

## 2023-08-17 DIAGNOSIS — R002 Palpitations: Secondary | ICD-10-CM

## 2023-08-17 NOTE — Progress Notes (Signed)
Cardiology Office Note    Date:  08/23/2023   ID:  Clifford Kelly, DOB February 17, 1986, MRN [id number]  PCP:  Park Meo, FNP  Cardiologist:  Nicki Guadalajara, MD   New cardiology consult referred by Kurtis Bushman, NP evaluation of possible high blood pressure.   History of Present Illness:  Clifford Kelly is a 37 y.o. 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  He has seen Kurtis Bushman, FNP at [facility] and when evaluated in July 2024 blood pressure was elevated at 128/92 with resting pulse of 68.  During that evaluation there was discussion regarding healthy weight and heart healthy diet in addition to exercising for 150 minutes of moderate intensity weekly.  An ECG from June 03, 2023 showed sinus bradycardia at 57 bpm without significant abnormalities.  Recently, he has experienced occasional episodes of lightheadedness and also has experienced occasional stomachaches.  He recently has started Prilosec which has improved his stomach discomfort.  He states his blood pressure is higher when he stands up.  Times he notes rare palpitations.  He denies any exertional symptoms.  He apparently had worn a monitor for 2 days in August 2024 which showed sinus rhythm with average heart rate at 70 bpm.  Apparently the slowest heart rate was sinus bradycardia at 35 and the fastest heart rate was sinus tachycardia at 157.  There was no episodes of VT or SVT or atrial fibrillation.  There were rare PACs and PVCs.  Clinically he feels well but it now presents for cardiology evaluation  No past medical history on file.  Past Surgical History:  Procedure Laterality Date   TONSILLECTOMY      Current Medications: Outpatient Medications Prior to Visit  Medication Sig Dispense Refill   omeprazole (PRILOSEC) 20 MG capsule Take 1 capsule (20 mg total) by mouth daily. 90 capsule 1   No facility-administered medications prior to visit.     Allergies:   Patient has no  known allergies.   Social History   Socioeconomic History   Marital status: Married    Spouse name: Not on file   Number of children: Not on file   Years of education: Not on file   Highest education level: Some college, no degree  Occupational History   Occupation: Tree Cutter/Landscaper  Tobacco Use   Smoking status: Never    Passive exposure: Never   Smokeless tobacco: Never  Vaping Use   Vaping status: Never Used  Substance and Sexual Activity   Alcohol use: Yes    Alcohol/week: 21.0 standard drinks of alcohol    Types: 21 Cans of beer per week    Comment: 2-3 beers daily   Drug use: Never   Sexual activity: Yes  Other Topics Concern   Not on file  Social History Narrative   Not on file   Social Determinants of Health   Financial Resource Strain: Low Risk  (06/22/2023)   Overall Financial Resource Strain (CARDIA)    Difficulty of Paying Living Expenses: Not hard at all  Food Insecurity: No Food Insecurity (06/22/2023)   Hunger Vital Sign    Worried About Running Out of Food in the Last Year: Never true    Ran Out of Food in the Last Year: Never true  Transportation Needs: No Transportation Needs (06/22/2023)   PRAPARE - Administrator, Civil Service (Medical): No    Lack of Transportation (Non-Medical): No  Physical Activity: Sufficiently Active (06/22/2023)  Exercise Vital Sign    Days of Exercise per Week: 7 days    Minutes of Exercise per Session: 150+ min  Stress: No Stress Concern Present (06/22/2023)   [employer] of Occupational Health - Occupational Stress Questionnaire    Feeling of Stress : Not at all  Social Connections: Moderately Isolated (06/22/2023)   Social Connection and Isolation Panel [NHANES]    Frequency of Communication with Friends and Family: Three times a week    Frequency of Social Gatherings with Friends and Family: Once a week    Attends Religious Services: Never    Database administrator or Organizations: No     Attends Engineer, structural: Not on file    Marital Status: Married    Social history is notable and that he was born in [place] and has been in [place] over 21 years.  He is in his second marriage 5 years and has 2 children.  He completed 12 grade of education.  He does drink occasional wine and liquor.  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   Family History:  The patient's family history includes Acne in his mother; Autism in his brother; Cancer in his maternal aunt, maternal grandfather, maternal grandmother, paternal grandfather, and paternal grandmother; Emphysema in his maternal grandfather and maternal grandmother; Heart disease in his mother; Hyperlipidemia in his mother; Hypertension in his mother.   ROS General: Negative; No fevers, chills, or night sweats;  HEENT: Negative; No changes in vision or hearing, sinus congestion, difficulty swallowing Pulmonary: Negative; No cough, wheezing, shortness of breath, hemoptysis Cardiovascular: Negative; No chest pain, presyncope, syncope, palpitations GI: Negative; No nausea, vomiting, diarrhea, or abdominal pain GU: Negative; No dysuria, hematuria, or difficulty voiding Musculoskeletal: Negative; no myalgias, joint pain, or weakness Hematologic/Oncology: Negative; no easy bruising, bleeding Endocrine: Negative; no heat/cold intolerance; no diabetes Neuro: Negative; no changes in balance, headaches Skin: Negative; No rashes or skin lesions Psychiatric: Negative; No behavioral problems, depression Sleep: Negative; No snoring, daytime sleepiness, hypersomnolence, bruxism, restless legs, hypnogognic hallucinations, no cataplexy Other comprehensive 14 point system review is negative.   PHYSICAL EXAM:   VS:  BP 134/86 (BP Location: Left Arm, Patient Position: Sitting, Cuff Size: Normal)   Pulse (!) 59   Ht 6\' 3"  (1.905 m)   Wt 231 lb (104.8 kg)   SpO2 96%   BMI 28.87  kg/m     Blood pressure by me was 120/80 supine and was 124/82 standing.  Wt Readings from Last 3 Encounters:  08/17/23 231 lb (104.8 kg)  07/06/23 229 lb (103.9 kg)  06/23/23 231 lb (104.8 kg)    General: Alert, oriented, no distress.  Skin: normal turgor, no rashes, warm and dry HEENT: Normocephalic, atraumatic. Pupils equal round and reactive to light; sclera anicteric; extraocular muscles intact;  Nose without nasal septal hypertrophy Mouth/Parynx benign; Mallinpatti scale 3 Neck: No JVD, no carotid bruits; normal carotid upstroke Lungs: clear to ausculatation and percussion; no wheezing or rales Chest wall: without tenderness to palpitation Heart: PMI not displaced, RRR, s1 s2 normal, faint 1/6 systolic murmur, no diastolic murmur, no rubs, gallops, thrills, or heaves Abdomen: soft, nontender; no hepatosplenomehaly, BS+; abdominal aorta nontender and not dilated by palpation. Back: no CVA tenderness Pulses 2+ Musculoskeletal: full range of motion, normal strength, no joint deformities Extremities: no clubbing cyanosis or edema, Homan's sign negative  Neurologic: grossly nonfocal; Cranial nerves grossly wnl Psychologic: Normal mood and affect  Studies/Labs Reviewed:   EKG Interpretation Date/Time:  Wednesday August 17 2023 13:26:22 EDT Ventricular Rate:  59 PR Interval:  156 QRS Duration:  112 QT Interval:  424 QTC Calculation: 419 R Axis:   73  Text Interpretation: Sinus bradycardia Incomplete right bundle branch block Confirmed by Nicki Guadalajara ([id number]) on 08/17/2023 1:31:54 PM    Recent Labs:    Latest Ref Rng & Units 06/23/2023   12:29 PM 05/13/2023    8:33 AM 09/04/2021    8:39 AM  BMP  Glucose 65 - 99 mg/dL 86  678  938   BUN 7 - 25 mg/dL 26  17  22    Creatinine 0.60 - 1.26 mg/dL 1.01  7.51  0.25   BUN/Creat Ratio 6 - 22 (calc) 22  SEE NOTE:  19   Sodium 135 - 146 mmol/L 140  139  139   Potassium 3.5 - 5.3 mmol/L 4.4  4.5  4.7   Chloride 98 - 110 mmol/L  103  102  103   CO2 20 - 32 mmol/L 26  28  24    Calcium 8.6 - 10.3 mg/dL 85.2  9.4  9.6         Latest Ref Rng & Units 06/23/2023   12:29 PM 05/13/2023    8:33 AM 09/04/2021    8:39 AM  Hepatic Function  Total Protein 6.1 - 8.1 g/dL 7.2  6.7  6.7   Albumin 4.0 - 5.0 g/dL   4.6   AST 10 - 40 U/L 30  27  31    ALT 9 - 46 U/L 47  33  40   Alk Phosphatase 44 - 121 IU/L   57   Total Bilirubin 0.2 - 1.2 mg/dL 0.5  0.6  0.6        Latest Ref Rng & Units 06/23/2023   12:29 PM 05/13/2023    8:33 AM 09/04/2021    8:40 AM  CBC  WBC 3.8 - 10.8 Thousand/uL 8.9  7.2  8.0   Hemoglobin 13.2 - 17.1 g/dL 77.8  24.2  35.3   Hematocrit 38.5 - 50.0 % 45.6  44.8  46.9   Platelets 140 - 400 Thousand/uL 317  300  311    Lab Results  Component Value Date   MCV 88.4 06/23/2023   MCV 89.6 05/13/2023   MCV 90 09/04/2021   Lab Results  Component Value Date   TSH 1.61 05/13/2023   Lab Results  Component Value Date   HGBA1C 5.5 05/13/2023     BNP No results found for: "BNP"  ProBNP No results found for: "PROBNP"   Lipid Panel     Component Value Date/Time   CHOL 171 05/13/2023 0833   CHOL 180 09/04/2021 0840   TRIG 46 05/13/2023 0833   HDL 58 05/13/2023 0833   HDL 54 09/04/2021 0840   CHOLHDL 2.9 05/13/2023 0833   LDLCALC 99 05/13/2023 0833   LABVLDL 11 09/04/2021 0840     RADIOLOGY: LONG TERM MONITOR (3-14 DAYS)  Result Date: 08/17/2023 NSR with sinus brady (35/min) and sinus tachy (157/min) with ave HR of 70/min. No VT or SVT or atrial fib Rare PAC's and PVC's. No prolonged pauses Patch Wear Time:  3 days and 19 hours (2024-09-08T20:03:39-0400 to 2024-10-04T17:28:50-0400) Dorathy Daft     Additional studies/ records that were reviewed today include:  I reviewed the records from [facility], FNP, as well as his recent monitor.  ASSESSMENT:    1. Sinus bradycardia   2.  Systolic murmur   3. Lightheadedness   4. Palpitations    PLAN:  Clifford Kelly is a healthy  appearing 37 year old gentleman who was evaluated several months ago and was noted to have diastolic hypertension with blood pressure at 128/92 when seen by Kurtis Bushman, FNP on May 05, 2023.  He denies any history of chest pain.  At times he has noted some mild lightheadedness and for 2 months had experienced some vague stomach aches which have improved with as needed Prilosec.  He is very busy at work and denies any exertional shortness of breath or chest pain.  On his 2-day monitor recording, he was in sinus rhythm and was noted to have rare PACs and PVCs.  The slowest heart rate was sinus bradycardia which occurred most likely while sleeping and the fastest was sinus tachycardia during activity.  I do not have the time of these events.  He was not found to have any episodes of atrial fibrillation, SVT, or ventricular tachycardia and there were no prolonged pauses.  On exam today, his blood pressure is stable by me at 120/80 supine and standing was 124/82.  We discussed the importance of sodium restriction in his diet which may play a role in his blood pressure elevation.  I discussed also that diastolic hypertension may play more of a role in the younger individual and as he gets older there may be more potential for development of systolic hypertension.  Clinically he is stable and is essentially asymptomatic.  His ECG today shows sinus rhythm at 59 bpm with very mild incomplete right bundle branch block.  There are no significant ST-T changes.  Have recommended we obtain a baseline echo Doppler study for assessment of LV systolic and diastolic function and valvular architecture.  Clinically he will monitor his blood pressure and heart rate and if he develops recurrent episodes of significant lightheadedness associated with bradycardia follow-up evaluation should be undertaken.  However as long as he remains stable I will be available on an as-needed basis or sooner if problems arise.   Medication  Adjustments/Labs and Tests Ordered: Current medicines are reviewed at length with the patient today.  Concerns regarding medicines are outlined above.  Medication changes, Labs and Tests ordered today are listed in the Patient Instructions below. Patient Instructions  Medication Instructions:  No changes to medication *If you need a refill on your cardiac medications before your next appointment, please call your pharmacy*   Lab Work: No labs If you have labs (blood work) drawn today and your tests are completely normal, you will receive your results only by: MyChart Message (if you have MyChart) OR A paper copy in the mail If you have any lab test that is abnormal or we need to change your treatment, we will call you to review the results.   Testing/Procedures: Your physician has requested that you have an echocardiogram. Echocardiography is a painless test that uses sound waves to create images of your heart. It provides your doctor with information about the size and shape of your heart and how well your heart's chambers and valves are working. This procedure takes approximately one hour. There are no restrictions for this procedure. Please do NOT wear cologne, perfume, aftershave, or lotions (deodorant is allowed). Please arrive 15 minutes prior to your appointment time.    Follow-Up: At [facility], you and your health needs are our priority.  As part of our continuing mission to provide you with exceptional heart care, we have  created designated Provider Care Teams.  These Care Teams include your primary Cardiologist (physician) and Advanced Practice Providers (APPs -  Physician Assistants and Nurse Practitioners) who all work together to provide you with the care you need, when you need it.  We recommend signing up for the patient portal called "MyChart".  Sign up information is provided on this After Visit Summary.  MyChart is used to connect with patients for Virtual  Visits (Telemedicine).  Patients are able to view lab/test results, encounter notes, upcoming appointments, etc.  Non-urgent messages can be sent to your provider as well.   To learn more about what you can do with MyChart, go to [email or web address].    Your next appointment:  as needed     Provider:   Dr. Nicki Guadalajara       Signed, Nicki Guadalajara, MD  08/23/2023 5:58 PM    [facility] [address], [place], [place]  [address] Phone: [phone]

## 2023-08-17 NOTE — Patient Instructions (Signed)
Medication Instructions:  No changes to medication *If you need a refill on your cardiac medications before your next appointment, please call your pharmacy*   Lab Work: No labs If you have labs (blood work) drawn today and your tests are completely normal, you will receive your results only by: MyChart Message (if you have MyChart) OR A paper copy in the mail If you have any lab test that is abnormal or we need to change your treatment, we will call you to review the results.   Testing/Procedures: Your physician has requested that you have an echocardiogram. Echocardiography is a painless test that uses sound waves to create images of your heart. It provides your doctor with information about the size and shape of your heart and how well your heart's chambers and valves are working. This procedure takes approximately one hour. There are no restrictions for this procedure. Please do NOT wear cologne, perfume, aftershave, or lotions (deodorant is allowed). Please arrive 15 minutes prior to your appointment time.    Follow-Up: At [facility], you and your health needs are our priority.  As part of our continuing mission to provide you with exceptional heart care, we have created designated Provider Care Teams.  These Care Teams include your primary Cardiologist (physician) and Advanced Practice Providers (APPs -  Physician Assistants and Nurse Practitioners) who all work together to provide you with the care you need, when you need it.  We recommend signing up for the patient portal called "MyChart".  Sign up information is provided on this After Visit Summary.  MyChart is used to connect with patients for Virtual Visits (Telemedicine).  Patients are able to view lab/test results, encounter notes, upcoming appointments, etc.  Non-urgent messages can be sent to your provider as well.   To learn more about what you can do with MyChart, go to [email or web address].    Your next  appointment:  as needed     Provider:   Dr. Nicki Guadalajara

## 2023-08-23 ENCOUNTER — Encounter: Payer: Self-pay | Admitting: Cardiovascular Disease

## 2023-08-23 ENCOUNTER — Other Ambulatory Visit: Payer: Self-pay | Admitting: Family Medicine

## 2023-08-23 DIAGNOSIS — R1013 Epigastric pain: Secondary | ICD-10-CM

## 2023-09-13 ENCOUNTER — Telehealth (HOSPITAL_COMMUNITY): Payer: Self-pay | Admitting: Cardiovascular Disease

## 2023-09-13 NOTE — Telephone Encounter (Signed)
Patient called and cancelled echocardiogram scheduled for 09/15/23. He does not wish to reschedule at this time . Order will be removed from the active echo WQ. If patient calls back to schedule we will reinstate the order. Thank you.

## 2023-09-15 ENCOUNTER — Ambulatory Visit (HOSPITAL_COMMUNITY): Payer: 59
